# Patient Record
Sex: Male | Born: 1995 | Race: Black or African American | Hispanic: No | Marital: Single | Smoking: Current some day smoker
Health system: Southern US, Community
[De-identification: ages and names within clinical notes are randomized; demographics above are authoritative.]

---

## 2017-11-07 ENCOUNTER — Emergency Department
Admission: EM | Admit: 2017-11-07 | Discharge: 2017-11-07 | Disposition: A | Discharge status: Home / Self Care | Payer: Self-pay | Attending: Emergency Medicine | Admitting: Emergency Medicine

## 2017-11-07 ENCOUNTER — Other Ambulatory Visit: Payer: Self-pay

## 2017-11-07 DIAGNOSIS — F17200 Nicotine dependence, unspecified, uncomplicated: Secondary | ICD-10-CM | POA: Insufficient documentation

## 2017-11-07 DIAGNOSIS — M25511 Pain in right shoulder: Secondary | ICD-10-CM | POA: Insufficient documentation

## 2017-11-07 MED ORDER — IBUPROFEN 800 MG PO TABS: 800.0000 mg | ORAL_TABLET | Freq: Three times a day (TID) | ORAL | 0 refills | Status: DC | PRN

## 2017-11-07 NOTE — ED Triage Notes (Addendum)
Pt comes via POV with c/o right shoulder pain that began last Wednesday after incident at work. Pt states a chain fell on his shoulder. Pt denies LOC. Respirations even and unlabored. Pt states he wishes to file workers comp for incident. Pt works at [employer] here in [place].

## 2017-11-07 NOTE — Discharge Instructions (Signed)
Use sling for 2 days

## 2017-11-07 NOTE — ED Provider Notes (Signed)
[facility] Emergency Department Provider Note  ____________________________________________  Time seen: Approximately 2:15 PM  I have reviewed the triage vital signs and the nursing notes.   HISTORY  Chief Complaint Shoulder Pain    HPI Tony Green is a 22 y.o. male that presents to the emergency department for evaluation of right shoulder pain for 5 days.  Patient states that he was at work loading something and a chain fell on his shoulder.  He states that he has been using his shoulder but "its not 100%."  He would like a sling.  He is not concerned that anything is broken.  No additional injuries.  No numbness, tingling.   History reviewed. No pertinent past medical history.  There are no active problems to display for this patient.   History reviewed. No pertinent surgical history.  Prior to Admission medications   Medication Sig Start Date End Date Taking? Authorizing Provider  ibuprofen (ADVIL,MOTRIN) 800 MG tablet Take 1 tablet (800 mg total) by mouth every 8 (eight) hours as needed. 11/07/17   Enid Derry, PA-C    Allergies Patient has no allergy information on record.  No family history on file.  Social History Social History   Tobacco Use  . Smoking status: Current Some Day Smoker  . Smokeless tobacco: Never Used  Substance Use Topics  . Alcohol use: No    Frequency: Never  . Drug use: No     Review of Systems  Cardiovascular: No chest pain. Respiratory: No SOB. Gastrointestinal: No abdominal pain.   Musculoskeletal: Positive for shoulder pain. Skin: Negative for rash, abrasions, lacerations, ecchymosis. Neurological: Negative for numbness or tingling   ____________________________________________   PHYSICAL EXAM:  VITAL SIGNS: ED Triage Vitals  Enc Vitals Group     BP 11/07/17 1325 139/65     Pulse Rate 11/07/17 1325 (!) 57     Resp 11/07/17 1325 16     Temp 11/07/17 1325 98.1 F (36.7 C)     Temp Source  11/07/17 1325 Oral     SpO2 11/07/17 1325 100 %     Weight 11/07/17 1326 140 lb (63.5 kg)     Height 11/07/17 1326 5\' 5"  (1.651 m)     Head Circumference --      Peak Flow --      Pain Score 11/07/17 1332 5     Pain Loc --      Pain Edu? --      Excl. in GC? --      Constitutional: Alert and oriented. Well appearing and in no acute distress. Eyes: Conjunctivae are normal. PERRL. EOMI. Head: Atraumatic. ENT:      Ears:      Nose: No congestion/rhinnorhea.      Mouth/Throat: Mucous membranes are moist.  Neck: No stridor.  No cervical spine tenderness to palpation. Cardiovascular: Normal rate, regular rhythm.  Good peripheral circulation.  Symmetric radial pulses bilaterally. Respiratory: Normal respiratory effort without tachypnea or retractions. Lungs CTAB. Good air entry to the bases with no decreased or absent breath sounds. Musculoskeletal: Full range of motion to all extremities. No gross deformities appreciated.  Strength 5 out of 5 in upper extremities bilaterally.  No pain elicited with range of motion of right shoulder or with palpation of right shoulder.  No swelling, ecchymosis, erythema. Neurologic:  Normal speech and language. No gross focal neurologic deficits are appreciated.  Skin:  Skin is warm, dry and intact. No rash noted.   ____________________________________________   Vickie Epley (  all labs ordered are listed, but only abnormal results are displayed)  Labs Reviewed - No data to display ____________________________________________  EKG   ____________________________________________  RADIOLOGY   No results found.  ____________________________________________    PROCEDURES  Procedure(s) performed:    Procedures    Medications - No data to display   ____________________________________________   INITIAL IMPRESSION / ASSESSMENT AND PLAN / ED COURSE  Pertinent labs & imaging results that were available during my care of the patient were  reviewed by me and considered in my medical decision making (see chart for details).  Review of the [place] CSRS was performed in accordance of the [facility] prior to dispensing any controlled drugs.     Patient presented to the emergency department for evaluation of right shoulder pain for 6 days.  Vital signs and exam are reassuring.  Pain is likely musculoskeletal.  X-ray was offered and patient declined.  Patient asked for a sling and sling was given. He was recommended to only wear for 2 days.  Patient will be discharged home with prescriptions for ibuprofen. Patient is to follow up with PCP as directed. Patient is given ED precautions to return to the ED for any worsening or new symptoms.     ____________________________________________  FINAL CLINICAL IMPRESSION(S) / ED DIAGNOSES  Final diagnoses:  Acute pain of right shoulder      NEW MEDICATIONS STARTED DURING THIS VISIT:  ED Discharge Orders        Ordered    ibuprofen (ADVIL,MOTRIN) 800 MG tablet  Every 8 hours PRN     11/07/17 1449          This chart was dictated using voice recognition software/Dragon. Despite best efforts to proofread, errors can occur which can change the meaning. Any change was purely unintentional.    Enid DerryWagner, Skyeler Smola, PA-C 11/07/17 1454    Rockne MenghiniNorman, Anne-Caroline, MD 11/07/17 [id number]

## 2017-11-07 NOTE — ED Notes (Addendum)
See triage note  States he hurt is shoulder last weds while at work.  Having intermittent pain to right shoulder area  No deformity noted    Spoke with [place] [facility] [employer]   She states that he did not report the injury  And is no longer employed

## 2018-01-21 ENCOUNTER — Emergency Department: Payer: Self-pay

## 2018-01-21 ENCOUNTER — Encounter: Payer: Self-pay | Admitting: Emergency Medicine

## 2018-01-21 ENCOUNTER — Emergency Department
Admission: EM | Admit: 2018-01-21 | Discharge: 2018-01-22 | Disposition: A | Discharge status: Home / Self Care | Payer: Self-pay | Attending: Emergency Medicine | Admitting: Emergency Medicine

## 2018-01-21 DIAGNOSIS — S60142A Contusion of left ring finger with damage to nail, initial encounter: Secondary | ICD-10-CM | POA: Insufficient documentation

## 2018-01-21 DIAGNOSIS — Y929 Unspecified place or not applicable: Secondary | ICD-10-CM | POA: Insufficient documentation

## 2018-01-21 DIAGNOSIS — W231XXA Caught, crushed, jammed, or pinched between stationary objects, initial encounter: Secondary | ICD-10-CM | POA: Insufficient documentation

## 2018-01-21 DIAGNOSIS — S6010XA Contusion of unspecified finger with damage to nail, initial encounter: Secondary | ICD-10-CM

## 2018-01-21 DIAGNOSIS — Y999 Unspecified external cause status: Secondary | ICD-10-CM | POA: Insufficient documentation

## 2018-01-21 DIAGNOSIS — Y939 Activity, unspecified: Secondary | ICD-10-CM | POA: Insufficient documentation

## 2018-01-21 DIAGNOSIS — F17200 Nicotine dependence, unspecified, uncomplicated: Secondary | ICD-10-CM | POA: Insufficient documentation

## 2018-01-21 MED ORDER — MELOXICAM 15 MG PO TABS: 15.0000 mg | ORAL_TABLET | Freq: Every day | ORAL | 1 refills | Status: AC

## 2018-01-21 MED ORDER — MELOXICAM 7.5 MG PO TABS
15.0000 mg | ORAL_TABLET | Freq: Every day | ORAL | Status: DC
  Administered 2018-01-22: 15 mg via ORAL

## 2018-01-21 NOTE — ED Triage Notes (Signed)
Pt slammed left hand in car door this evening and is in ED tonight for pain, swelling and imaging for possible break. No obvious deformity noted.

## 2018-01-21 NOTE — ED Notes (Signed)
Pt reports he shut the tips of his 3rd, 4th and 5th finger on his left hand in the car door. Pt reports pain to those 3 fingers. Collection of blood noted under the finger nail of the 4th finger. CMS intacta. Ice pack given to pt.

## 2018-01-22 MED ORDER — MELOXICAM 7.5 MG PO TABS
ORAL_TABLET | ORAL | Status: AC
  Filled 2018-01-22: qty 2

## 2018-01-22 NOTE — ED Provider Notes (Signed)
[facility] Emergency Department Provider Note  ____________________________________________  Time seen: Approximately 12:14 AM  I have reviewed the triage vital signs and the nursing notes.   HISTORY  Chief Complaint Hand Injury    HPI Tony Green is a 22 y.o. male presents to the emergency department with a left fourth digit subungual hematoma after patient reportedly slammed his left hand in car door.  He is reporting 8 out of 10, aching pain.  No alleviating measures have been attempted.  Injury occurred this evening.  History reviewed. No pertinent past medical history.  There are no active problems to display for this patient.   History reviewed. No pertinent surgical history.  Prior to Admission medications   Medication Sig Start Date End Date Taking? Authorizing Provider  ibuprofen (ADVIL,MOTRIN) 800 MG tablet Take 1 tablet (800 mg total) by mouth every 8 (eight) hours as needed. 11/07/17   Enid Derry, PA-C  meloxicam (MOBIC) 15 MG tablet Take 1 tablet (15 mg total) by mouth daily for 7 days. 01/21/18 01/28/18  Orvil Feil, PA-C    Allergies Patient has no known allergies.  History reviewed. No pertinent family history.  Social History Social History   Tobacco Use  . Smoking status: Current Some Day Smoker  . Smokeless tobacco: Never Used  Substance Use Topics  . Alcohol use: Yes    Frequency: Never  . Drug use: No     Review of Systems  Constitutional: No fever/chills Eyes: No visual changes. No discharge ENT: No upper respiratory complaints. Cardiovascular: no chest pain. Respiratory: no cough. No SOB. Gastrointestinal: No abdominal pain.  No nausea, no vomiting.  No diarrhea.  No constipation. Musculoskeletal: Negative for musculoskeletal pain. Skin: Patient has left fourth digit subungual hematoma. Neurological: Negative for headaches, focal weakness or  numbness.   ____________________________________________   PHYSICAL EXAM:  VITAL SIGNS: ED Triage Vitals  Enc Vitals Group     BP 01/21/18 2211 (!) 145/72     Pulse Rate 01/21/18 2211 (!) 59     Resp 01/22/18 0010 16     Temp 01/21/18 2211 97.6 F (36.4 C)     Temp Source 01/21/18 2211 Oral     SpO2 01/21/18 2211 100 %     Weight 01/21/18 2211 140 lb (63.5 kg)     Height 01/21/18 2211  (1.651 m)     Head Circumference --      Peak Flow --      Pain Score 01/22/18 0010 6     Pain Loc --      Pain Edu? --      Excl. in GC? --      Constitutional: Alert and oriented. Well appearing and in no acute distress. Eyes: Conjunctivae are normal. PERRL. EOMI. Head: Atraumatic. Cardiovascular: Normal rate, regular rhythm. Normal S1 and S2.  Good peripheral circulation. Respiratory: Normal respiratory effort without tachypnea or retractions. Lungs CTAB. Good air entry to the bases with no decreased or absent breath sounds. Musculoskeletal: Full range of motion to all extremities. No gross deformities appreciated. Neurologic:  Normal speech and language. No gross focal neurologic deficits are appreciated.  Skin:  Patient has left fourth digit subungual hematoma. Psychiatric: Mood and affect are normal. Speech and behavior are normal. Patient exhibits appropriate insight and judgement.   ____________________________________________   LABS (all labs ordered are listed, but only abnormal results are displayed)  Labs Reviewed - No data to display ____________________________________________  EKG   ____________________________________________  RADIOLOGY  Geraldo Pitter, personally viewed and evaluated these images (plain radiographs) as part of my medical decision making, as well as reviewing the written report by the radiologist.  Dg Hand Complete Left  Result Date: 01/21/2018 CLINICAL DATA:  Hand shut in car door.  Pain. EXAM: LEFT HAND - COMPLETE 3+ VIEW COMPARISON:   None. FINDINGS: There is no evidence of fracture or dislocation. There is no evidence of arthropathy or other focal bone abnormality. Soft tissues are unremarkable. IMPRESSION: Negative. Electronically Signed   By: Awilda Metro M.D.   On: 01/21/2018 23:31    ____________________________________________    PROCEDURES  Procedure(s) performed:    Procedures  Patient underwent trephination without complication.  Medications  meloxicam (MOBIC) tablet 15 mg (15 mg Oral Given 01/22/18 0009)     ____________________________________________   INITIAL IMPRESSION / ASSESSMENT AND PLAN / ED COURSE  Pertinent labs & imaging results that were available during my care of the patient were reviewed by me and considered in my medical decision making (see chart for details).  Review of the [place] CSRS was performed in accordance of the [facility] prior to dispensing any controlled drugs.     Assessment and plan Subungual hematoma Hand pain Patient presents to the emergency department with left hand pain after slamming his hand in the car door.  Differential diagnosis included fracture, contusion and subungual hematoma.  Patient underwent trephination in the emergency department without complication.  He was given his first dose of meloxicam in the emergency department.  He was discharged with meloxicam.  All patient questions were answered.    ____________________________________________  FINAL CLINICAL IMPRESSION(S) / ED DIAGNOSES  Final diagnoses:  Subungual hematoma of digit of hand, initial encounter      NEW MEDICATIONS STARTED DURING THIS VISIT:  ED Discharge Orders        Ordered    meloxicam (MOBIC) 15 MG tablet  Daily     01/21/18 2359          This chart was dictated using voice recognition software/Dragon. Despite best efforts to proofread, errors can occur which can change the meaning. Any change was purely unintentional.    Orvil Feil, PA-C 01/22/18  0017    Myrna Blazer, MD 01/22/18 2227

## 2019-03-08 IMAGING — CR DG HAND COMPLETE 3+V*L*
1 series · 3 of 3 positions shown · non-contrast
Comparison: None.

CLINICAL DATA: Hand shut in car door.  Pain.

EXAM:
LEFT HAND - COMPLETE 3+ VIEW

[Series 1: dg hand complete left · 0.14mm/px · 3 of 3 slices shown]
[im 1/3]
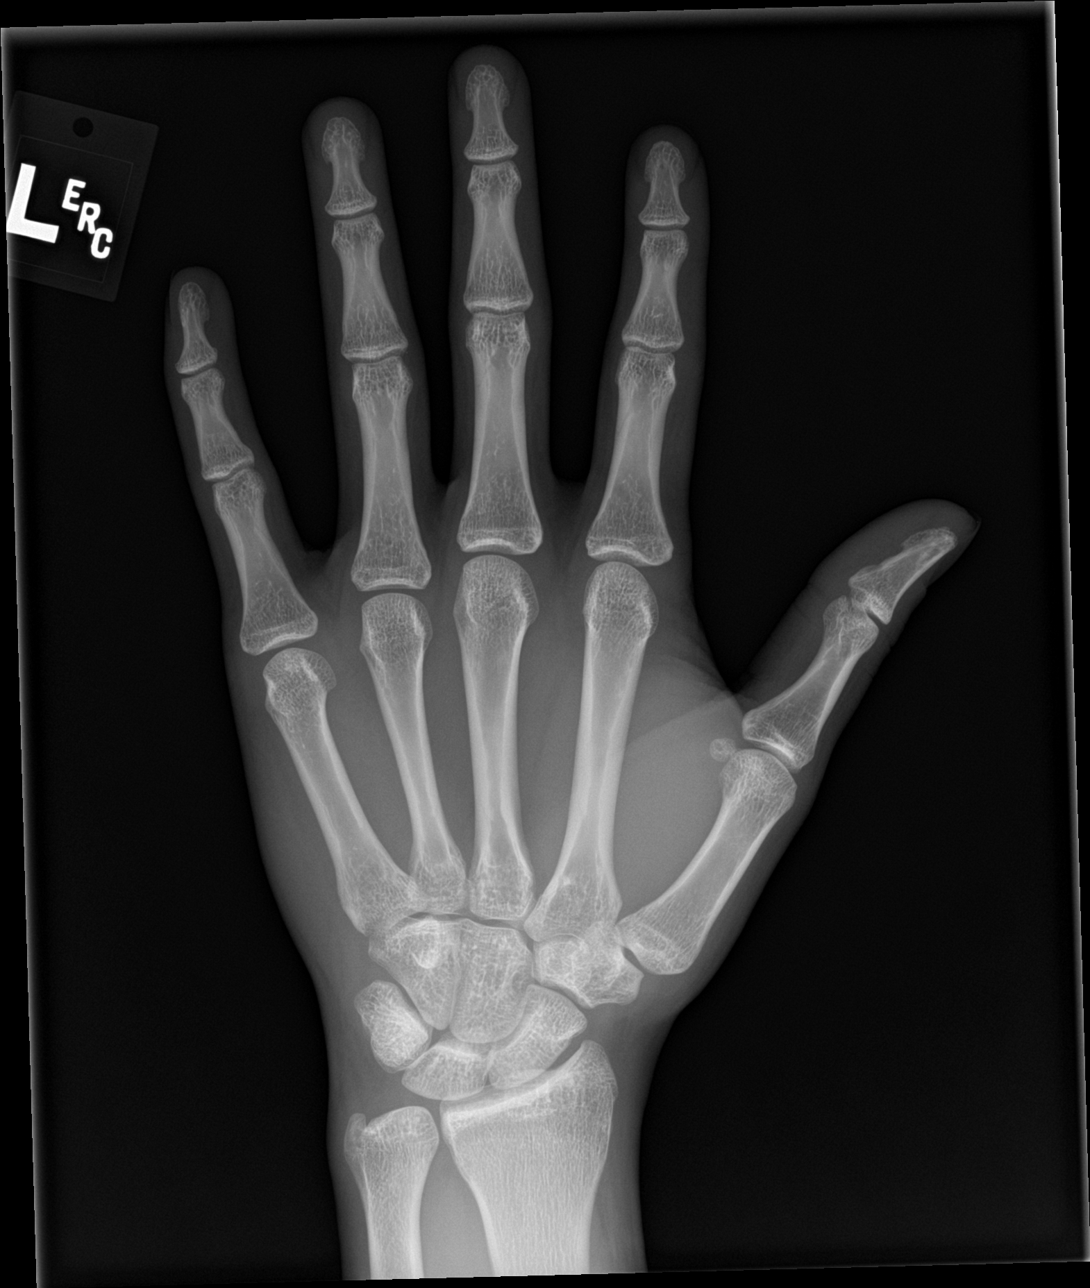
[im 2/3]
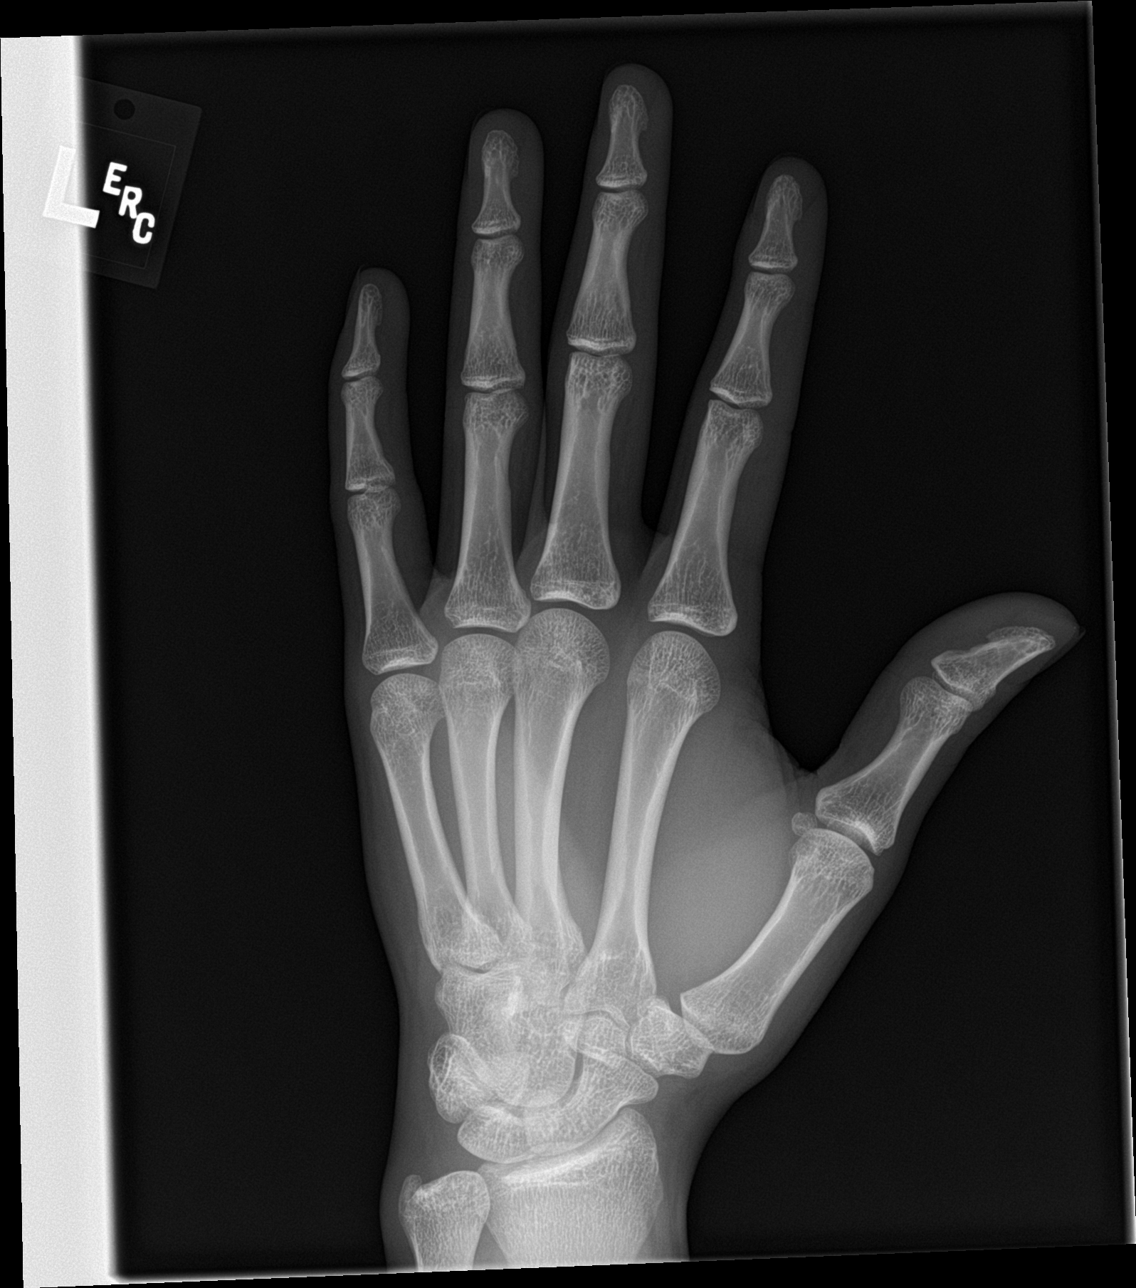
[im 3/3]
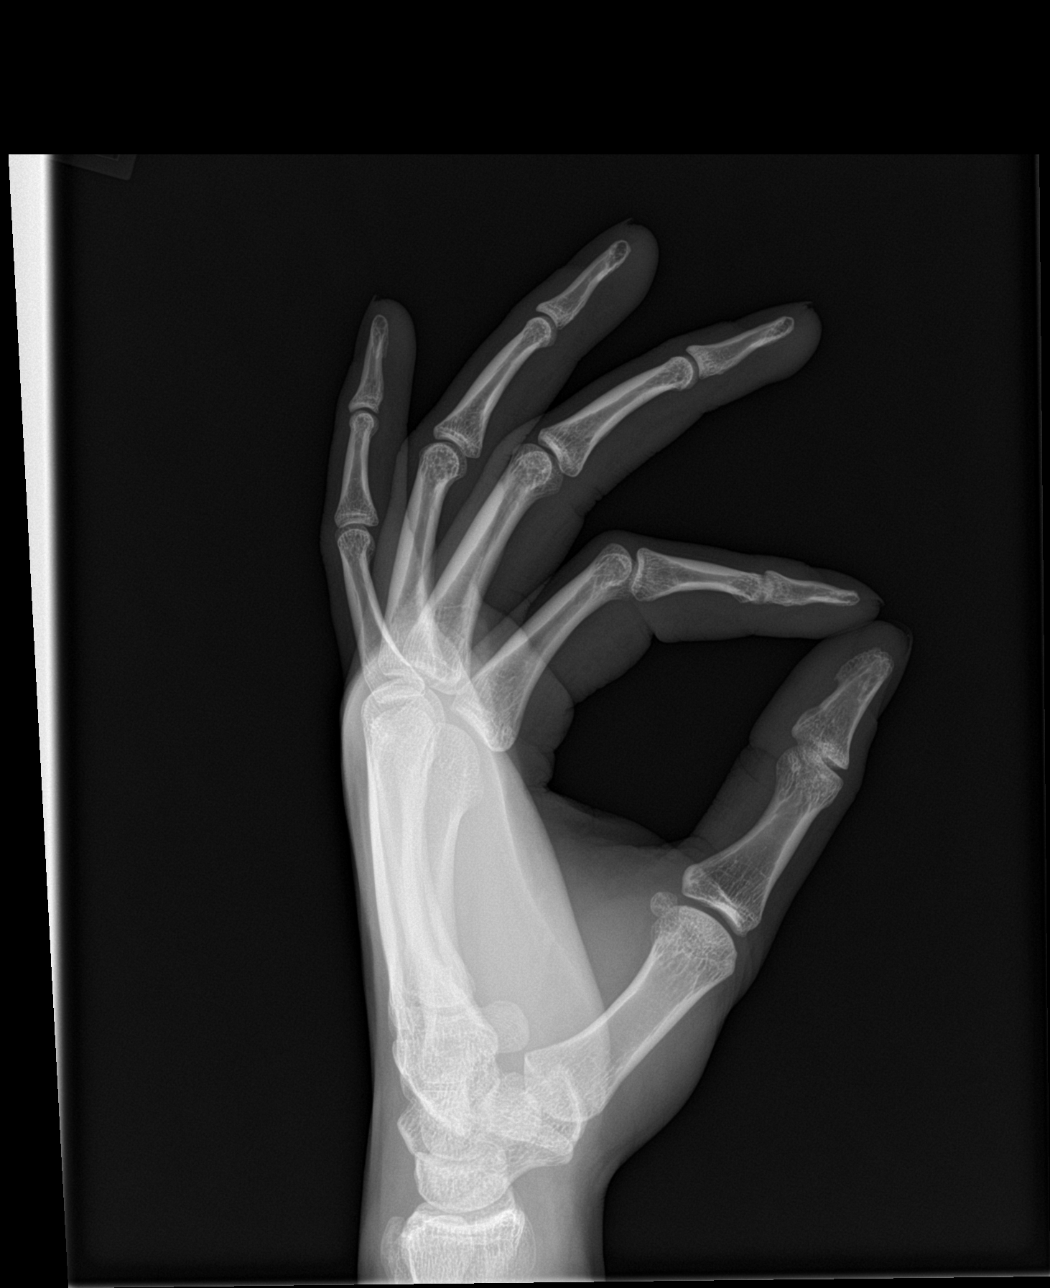

[3 of 3 positions shown; findings below may reference images not displayed]

FINDINGS: There is no evidence of fracture or dislocation. There is no
evidence of arthropathy or other focal bone abnormality. Soft
tissues are unremarkable.
IMPRESSION: Negative.

## 2019-03-25 ENCOUNTER — Other Ambulatory Visit: Payer: Self-pay

## 2019-03-25 ENCOUNTER — Encounter: Payer: Self-pay | Admitting: Emergency Medicine

## 2019-03-25 ENCOUNTER — Emergency Department
Admission: EM | Admit: 2019-03-25 | Discharge: 2019-03-25 | Disposition: A | Discharge status: Home / Self Care | Payer: HRSA Program | Attending: Emergency Medicine | Admitting: Emergency Medicine

## 2019-03-25 DIAGNOSIS — R509 Fever, unspecified: Secondary | ICD-10-CM | POA: Diagnosis present

## 2019-03-25 DIAGNOSIS — F17200 Nicotine dependence, unspecified, uncomplicated: Secondary | ICD-10-CM | POA: Diagnosis not present

## 2019-03-25 DIAGNOSIS — U071 COVID-19: Secondary | ICD-10-CM | POA: Diagnosis not present

## 2019-03-25 DIAGNOSIS — B349 Viral infection, unspecified: Secondary | ICD-10-CM

## 2019-03-25 NOTE — ED Triage Notes (Signed)
C/O chills, fever, productive cough, diarrhea x 1 day.  States boss tested positive for COVID 19.  Tylenol taken last at 0200.

## 2019-03-25 NOTE — Discharge Instructions (Signed)
Return to the ED if any severe worsening of your symptoms or difficulty breathing and increased shortness of breath.  Continue drinking fluids.  Take Tylenol or ibuprofen as needed for body aches or fever.  You are to stay out of work until you have received information about your COVID test.  If this test is positive you will need to stay out of work for 14 days.

## 2019-03-25 NOTE — ED Notes (Signed)
See triage note  Presents with h/a, subjective fever at home and intermittent cough  Low grade fever noted on arrival  States he was exposed to [place] by his employer

## 2019-03-25 NOTE — ED Provider Notes (Signed)
[facility] Emergency Department Provider Note  ____________________________________________   First MD Initiated Contact with Patient 03/25/19 [id number]     (approximate)  I have reviewed the triage vital signs and the nursing notes.   HISTORY  Chief Complaint not feeling well   HPI Tony Green is a 23 y.o. male presents to the ED with complaint of headache, subjective fever, intermittent cough and diarrhea starting today x1.  Patient states that his boss tested positive for Covid 19.  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  He took Tylenol at 2 AM.  He rates his pain as a 7 out of 10.      History reviewed. No pertinent past medical history.  There are no active problems to display for this patient.   History reviewed. No pertinent surgical history.  Prior to Admission medications   Not on File    Allergies Patient has no known allergies.  No family history on file.  Social History Social History   Tobacco Use  . Smoking status: Current Some Day Smoker  . Smokeless tobacco: Never Used  Substance Use Topics  . Alcohol use: Yes    Frequency: Never  . Drug use: No    Review of Systems Constitutional: Positive fever/chills Eyes: No visual changes. ENT: No sore throat. Cardiovascular: Denies chest pain. Respiratory: Denies shortness of breath.  Positive for intermittent cough. Gastrointestinal:  No nausea, no vomiting.  Positive for diarrhea. Musculoskeletal: Positive for muscle aches. Skin: Negative for rash. Neurological: Negative for headaches, focal weakness or numbness. ___________________________________________   PHYSICAL EXAM:  VITAL SIGNS: ED Triage Vitals  Enc Vitals Group     BP 03/25/19 0912 137/67     Pulse Rate 03/25/19 0912 75     Resp --      Temp 03/25/19 0912 100.3 F (37.9 C)     Temp Source 03/25/19 0912 Oral     SpO2 03/25/19 0912 100 %   Weight 03/25/19 0912 145 lb (65.8 kg)     Height 03/25/19 0912 5\' 5"  (1.651 m)     Head Circumference --      Peak Flow --      Pain Score 03/25/19 0924 7     Pain Loc --      Pain Edu? --      Excl. in GC? --     Constitutional: Alert and oriented. Well appearing and in no acute distress. Eyes: Conjunctivae are normal. PERRL. EOMI. Head: Atraumatic. Nose: No congestion/rhinnorhea. Mouth/Throat: Mucous membranes are moist.  Oropharynx non-erythematous. Neck: No stridor.   Hematological/Lymphatic/Immunilogical: No cervical lymphadenopathy. Cardiovascular: Normal rate, regular rhythm. Grossly normal heart sounds.  Good peripheral circulation. Respiratory: Normal respiratory effort.  No retractions. Lungs CTAB. Gastrointestinal: Soft and nontender. No distention. Musculoskeletal: Moves upper and lower extremities without any difficulty.  Normal gait was noted. Neurologic:  Normal speech and language. No gross focal neurologic deficits are appreciated. No gait instability. Skin:  Skin is warm, dry and intact. No rash noted. Psychiatric: Mood and affect are normal. Speech and behavior are normal.  ____________________________________________   LABS (all labs ordered are listed, but only abnormal results are displayed)  Labs Reviewed  NOVEL CORONAVIRUS, NAA (HOSPITAL ORDER, SEND-OUT TO REF LAB)   PROCEDURES  Procedure(s) performed (including Critical Care):  Procedures   ____________________________________________   INITIAL IMPRESSION / ASSESSMENT AND PLAN / ED COURSE  As part of my medical decision  making, I reviewed the following data within the electronic MEDICAL RECORD NUMBER Notes from prior ED visits and  Controlled Substance Database  23 year old male presents to the ED with complaint of fever, chills, productive cough for several days and diarrhea for 1 day.  He states that his boss tested positive for COVID-19.  He has been taking Tylenol for his fever with the last  being at 2 AM.  He denies any shortness of breath or difficulty breathing at this time.  A COVID test was sent out and patient was made aware that he will be out until his cover test is negative however he is aware that he will be out much longer if his test is positive.  ____________________________________________   FINAL CLINICAL IMPRESSION(S) / ED DIAGNOSES  Final diagnoses:  Viral illness     ED Discharge Orders    None       Note:  This document was prepared using Dragon voice recognition software and may include unintentional dictation errors.    Johnn Hai, PA-C 03/25/19 1040    Delman Kitten, MD 03/25/19 2124

## 2019-03-27 ENCOUNTER — Telehealth: Payer: Self-pay | Admitting: *Deleted

## 2019-03-27 LAB — NOVEL CORONAVIRUS, NAA (HOSP ORDER, SEND-OUT TO REF LAB; TAT 18-24 HRS): SARS-CoV-2, NAA: DETECTED — AB

## 2019-03-27 NOTE — Telephone Encounter (Signed)
Pt called to get his test results from the covid-19 test. His test showed that he was positive and he does have the virus. He has been in quarantine for the about 2 to 3 days. He had had a fever, body aches and some shortness of breath.  He is advised to continue to stay in quarantine for about 10 more days.   To be mindful of his symptoms and to call 911 for any respiratory distress, take Tylenol for the muscular pain. And also to stay hydrated, get fresh air and take his vitamins as before. He voiced understanding.

## 2021-06-04 ENCOUNTER — Emergency Department
Admission: EM | Admit: 2021-06-04 | Discharge: 2021-06-04 | Disposition: A | Discharge status: Home / Self Care | Payer: Medicaid Other | Attending: Emergency Medicine | Admitting: Emergency Medicine

## 2021-06-04 ENCOUNTER — Other Ambulatory Visit: Payer: Self-pay

## 2021-06-04 ENCOUNTER — Emergency Department: Payer: Medicaid Other

## 2021-06-04 ENCOUNTER — Encounter: Payer: Self-pay | Admitting: Emergency Medicine

## 2021-06-04 DIAGNOSIS — J069 Acute upper respiratory infection, unspecified: Secondary | ICD-10-CM | POA: Insufficient documentation

## 2021-06-04 DIAGNOSIS — Z20822 Contact with and (suspected) exposure to covid-19: Secondary | ICD-10-CM | POA: Insufficient documentation

## 2021-06-04 DIAGNOSIS — R059 Cough, unspecified: Secondary | ICD-10-CM | POA: Diagnosis present

## 2021-06-04 DIAGNOSIS — F17200 Nicotine dependence, unspecified, uncomplicated: Secondary | ICD-10-CM | POA: Insufficient documentation

## 2021-06-04 LAB — RESP PANEL BY RT-PCR (FLU A&B, COVID) ARPGX2
Influenza A by PCR: NEGATIVE
Influenza B by PCR: NEGATIVE
SARS Coronavirus 2 by RT PCR: NEGATIVE

## 2021-06-04 MED ORDER — PSEUDOEPH-BROMPHEN-DM 30-2-10 MG/5ML PO SYRP: 5.0000 mL | ORAL_SOLUTION | Freq: Four times a day (QID) | ORAL | 0 refills | Status: AC | PRN

## 2021-06-04 NOTE — ED Provider Notes (Signed)
HPI: Pt is a 25 y.o. male who presents with complaints of @CCN2 @.  The patient p/w  cough and wheezing two days.   ROS: Denies fever, chest pain, vomiting  History reviewed. No pertinent past medical history. There were no vitals filed for this visit.  Focused Physical Exam: Gen: No acute distress Head: atraumatic, normocephalic Eyes: Extraocular movements grossly intact; conjunctiva clear CV: RRR Lung: No increased WOB, no stridor GI: ND, no obvious masses Neuro: Alert and awake  Medical Decision Making and Plan: Given the patient's initial medical screening exam, the following diagnostic evaluation has been ordered. The patient will be placed in the appropriate treatment space, once one is available, to complete the evaluation and treatment. I have discussed the plan of care with the patient and I have advised the patient that an ED physician or mid-level practitioner will reevaluate their condition after the test results have been received, as the results may give them additional insight into the type of treatment they may need.   Diagnostics: no wheezing now. Xray and covid test   Treatments: none immediately   , MD 06/04/21 1345

## 2021-06-04 NOTE — ED Provider Notes (Signed)
[facility] Emergency Department Provider Note   ____________________________________________   Event Date/Time   First MD Initiated Contact with Patient 06/04/21 1304     (approximate)  I have reviewed the triage vital signs and the nursing notes.   HISTORY  Chief Complaint Cough and Wheezing    HPI Tony Green is a 25 y.o. male patient presents with 2 days of cough and wheezing.  Patient also have a daughter with the same complaints.  Denies recent travel or known contact with COVID-19.  Patient has taken the COVID-vaccine with boosters.  Denies chest pain or shortness of breath.  Denies fatigue of sore throat.  No palliative measure for complaint.      History reviewed. No pertinent past medical history.  There are no problems to display for this patient.   History reviewed. No pertinent surgical history.  Prior to Admission medications   Medication Sig Start Date End Date Taking? Authorizing Provider  brompheniramine-pseudoephedrine-DM 30-2-10 MG/5ML syrup Take 5 mLs by mouth 4 (four) times daily as needed. 06/04/21  Yes Joni Reining, PA-C    Allergies Patient has no known allergies.  History reviewed. No pertinent family history.  Social History Social History   Tobacco Use   Smoking status: Some Days   Smokeless tobacco: Never  Substance Use Topics   Alcohol use: Yes   Drug use: No    Review of Systems  Constitutional: No fever/chills Eyes: No visual changes. ENT: No sore throat. Cardiovascular: Denies chest pain. Respiratory: Denies shortness of breath.  Nonproductive cough Gastrointestinal: No abdominal pain.  No nausea, no vomiting.  No diarrhea.  No constipation. Genitourinary: Negative for dysuria. Musculoskeletal: Negative for back pain. Skin: Negative for rash. Neurological: Negative for headaches, focal weakness or numbness.   ____________________________________________   PHYSICAL EXAM:  VITAL  SIGNS: ED Triage Vitals  Enc Vitals Group     BP 06/04/21 1345 136/71     Pulse Rate 06/04/21 1345 97     Resp 06/04/21 1345 18     Temp 06/04/21 1345 98.9 F (37.2 C)     Temp Source 06/04/21 1345 Oral     SpO2 06/04/21 1345 98 %     Weight 06/04/21 1301 145 lb 1 oz (65.8 kg)     Height 06/04/21 1301 5\' 5"  (1.651 m)     Head Circumference --      Peak Flow --      Pain Score 06/04/21 1301 0     Pain Loc --      Pain Edu? --      Excl. in GC? --    Constitutional: Alert and oriented. Well appearing and in no acute distress. Eyes: Conjunctivae are normal. PERRL. EOMI. Head: Atraumatic. Nose: No congestion/rhinnorhea. Mouth/Throat: Mucous membranes are moist.  Oropharynx non-erythematous. Neck: No stridor.   Hematological/Lymphatic/Immunilogical: No cervical lymphadenopathy. Cardiovascular: Normal rate, regular rhythm. Grossly normal heart sounds.  Good peripheral circulation. Respiratory: Normal respiratory effort.  No retractions. Lungs CTAB. Genitourinary: Deferred Musculoskeletal: No lower extremity tenderness nor edema.  No joint effusions. Neurologic:  Normal speech and language. No gross focal neurologic deficits are appreciated. No gait instability. Skin:  Skin is warm, dry and intact. No rash noted. Psychiatric: Mood and affect are normal. Speech and behavior are normal.  ____________________________________________   LABS (all labs ordered are listed, but only abnormal results are displayed)  Labs Reviewed  RESP PANEL BY RT-PCR (FLU A&B, COVID) ARPGX2   ____________________________________________  EKG   ____________________________________________  RADIOLOGY I, Joni Reining, personally viewed and evaluated these images (plain radiographs) as part of my medical decision making, as well as reviewing the written report by the radiologist.  ED MD interpretation: No acute findings on chest x-ray.  Official radiology report(s): DG Chest 2 View  Result  Date: 06/04/2021 CLINICAL DATA:  Cough, shortness of breath EXAM: CHEST - 2 VIEW COMPARISON:  None. FINDINGS: The heart size and mediastinal contours are within normal limits. No focal airspace consolidation, pleural effusion, or pneumothorax. Slight scoliotic thoracic curvature. The visualized skeletal structures are otherwise unremarkable. IMPRESSION: No active cardiopulmonary disease. Electronically Signed   By: Duanne Guess D.O.   On: 06/04/2021 14:52    ____________________________________________   PROCEDURES  Procedure(s) performed (including Critical Care):  Procedures   ____________________________________________   INITIAL IMPRESSION / ASSESSMENT AND PLAN / ED COURSE  As part of my medical decision making, I reviewed the following data within the electronic MEDICAL RECORD NUMBER         Patient presents with 2 to 3 days of cough and wheezing.  Patient was concerned for COVID-19.  Advised patient is tested negative for COVID-19 and influenza.  Patient complaint physical exam consistent with viral respiratory infection with cough.  Patient given discharge care instruction advised take medication as directed.  Patient advised follow-up PCP      ____________________________________________   FINAL CLINICAL IMPRESSION(S) / ED DIAGNOSES  Final diagnoses:  Viral URI with cough     ED Discharge Orders          Ordered    brompheniramine-pseudoephedrine-DM 30-2-10 MG/5ML syrup  4 times daily PRN        06/04/21 1525             Note:  This document was prepared using Dragon voice recognition software and may include unintentional dictation errors.    Joni Reining, PA-C 06/04/21 1530    Jene Every, MD 06/08/21 1430

## 2021-06-04 NOTE — ED Triage Notes (Signed)
Pt comes into the ED via POV c/o cough and wheezing.  Pt has no audible wheezing at this time and is in NAd with even and unlabored respirations. Pt states symptoms have been ongoing x 2 days.

## 2021-06-04 NOTE — Discharge Instructions (Signed)
Your test was negative for COVID-19 and influenza.  No acute findings on chest x-ray.  Read and follow discharge care instruction.  Take medication as needed for cough.

## 2021-07-26 ENCOUNTER — Other Ambulatory Visit: Payer: Self-pay

## 2021-07-26 ENCOUNTER — Emergency Department
Admission: EM | Admit: 2021-07-26 | Discharge: 2021-07-26 | Disposition: A | Discharge status: Home / Self Care | Payer: Medicaid Other | Attending: Emergency Medicine | Admitting: Emergency Medicine

## 2021-07-26 DIAGNOSIS — F17200 Nicotine dependence, unspecified, uncomplicated: Secondary | ICD-10-CM | POA: Diagnosis not present

## 2021-07-26 DIAGNOSIS — H1031 Unspecified acute conjunctivitis, right eye: Secondary | ICD-10-CM

## 2021-07-26 DIAGNOSIS — J3489 Other specified disorders of nose and nasal sinuses: Secondary | ICD-10-CM | POA: Insufficient documentation

## 2021-07-26 DIAGNOSIS — R519 Headache, unspecified: Secondary | ICD-10-CM

## 2021-07-26 DIAGNOSIS — R0981 Nasal congestion: Secondary | ICD-10-CM | POA: Insufficient documentation

## 2021-07-26 MED ORDER — METOCLOPRAMIDE HCL 10 MG PO TABS
10.0000 mg | ORAL_TABLET | Freq: Once | ORAL | Status: AC
  Administered 2021-07-26: 10 mg via ORAL
  Filled 2021-07-26: qty 1

## 2021-07-26 MED ORDER — ERYTHROMYCIN 5 MG/GM OP OINT: 1.0000 "application " | TOPICAL_OINTMENT | Freq: Four times a day (QID) | OPHTHALMIC | 0 refills | Status: AC

## 2021-07-26 MED ORDER — KETOROLAC TROMETHAMINE 30 MG/ML IJ SOLN
15.0000 mg | Freq: Once | INTRAMUSCULAR | Status: AC
  Administered 2021-07-26: 15 mg via INTRAMUSCULAR
  Filled 2021-07-26: qty 1

## 2021-07-26 MED ORDER — NAPROXEN 250 MG PO TABS: 250.0000 mg | ORAL_TABLET | Freq: Two times a day (BID) | ORAL | 0 refills | Status: AC

## 2021-07-26 NOTE — ED Provider Notes (Signed)
[facility]  ____________________________________________   Event Date/Time   First MD Initiated Contact with Patient 07/26/21 [phone]     (approximate)  I have reviewed the triage vital signs and the nursing notes.   HISTORY  Chief Complaint Headache    HPI Tony Green is a 25 y.o. male with no significant past medical history presents with headache and facial pain.  Symptoms started 2 days ago.  Patient notes pain in the bilateral cheeks and primarily the left side of his forehead.  Also notes rhinorrhea and congestion.  Initially was having significant nasal discharge but now this has subsided.  He also notes some pain with biting on the left molars as well as right eye discomfort.  He denies fevers or chills.  No neck pain.  No sick contacts.  No history of migraines.  Has tried over-the-counter decongestants without relief.         History reviewed. No pertinent past medical history.  There are no problems to display for this patient.   History reviewed. No pertinent surgical history.  Prior to Admission medications   Medication Sig Start Date End Date Taking? Authorizing Provider  erythromycin ophthalmic ointment Place 1 application into the right eye 4 (four) times daily for 5 days. 07/26/21 07/31/21 Yes Rada Hay, MD  naproxen (NAPROSYN) 250 MG tablet Take 1 tablet (250 mg total) by mouth 2 (two) times daily with a meal for 7 days. 07/26/21 08/02/21 Yes Rada Hay, MD  brompheniramine-pseudoephedrine-DM 30-2-10 MG/5ML syrup Take 5 mLs by mouth 4 (four) times daily as needed. 06/04/21   Sable Feil, PA-C    Allergies Patient has no known allergies.  History reviewed. No pertinent family history.  Social History Social History   Tobacco Use   Smoking status: Some Days   Smokeless tobacco: Never  Substance Use Topics   Alcohol use: Yes   Drug use: No    Review of Systems   Review of Systems  Constitutional:   Negative for chills and fever.  HENT:  Positive for congestion, rhinorrhea, sinus pressure and sinus pain. Negative for ear pain and facial swelling.   Eyes:  Positive for pain and redness. Negative for photophobia and visual disturbance.  Musculoskeletal:  Negative for neck pain.  Neurological:  Positive for headaches. Negative for weakness and numbness.  All other systems reviewed and are negative.  Physical Exam Updated Vital Signs BP (!) 143/77   Pulse 94   Temp 98.9 F (37.2 C) (Oral)   Resp 16   Ht 5\' 5"  (1.651 m)   Wt 72.6 kg   SpO2 99%   BMI 26.63 kg/m   Physical Exam Vitals and nursing note reviewed.  Constitutional:      General: He is not in acute distress.    Appearance: Normal appearance.  HENT:     Head: Normocephalic and atraumatic.  Eyes:     General: No scleral icterus.    Extraocular Movements: Extraocular movements intact.     Conjunctiva/sclera: Conjunctivae normal.     Pupils: Pupils are equal, round, and reactive to light.     Comments: Right eye with conjunctival injection and some purulent crusting at the medial boarder No pain with extraocular movements and extraocular movements are intact Palpable right preauricular lymph node  Is to palpation over the bilateral maxillary sinuses  No significant facial swelling, no tenderness to percussion of the lower molars  Bilateral shotty cervical lymphadenopathy  Pulmonary:  Effort: Pulmonary effort is normal. No respiratory distress.     Breath sounds: Normal breath sounds. No wheezing.  Musculoskeletal:        General: No deformity or signs of injury.     Cervical back: Normal range of motion.  Skin:    Coloration: Skin is not jaundiced or pale.  Neurological:     General: No focal deficit present.     Mental Status: He is alert and oriented to person, place, and time. Mental status is at baseline.     Comments: Aox3, nml speech  PERRL, EOMI, face symmetric, nml tongue movement  5/5 strength  in the BL upper and lower extremities  Sensation grossly intact in the BL upper and lower extremities  Finger-nose-finger intact BL   Psychiatric:        Mood and Affect: Mood normal.        Behavior: Behavior normal.     LABS (all labs ordered are listed, but only abnormal results are displayed)  Labs Reviewed - No data to display ____________________________________________  EKG  N/a ____________________________________________  RADIOLOGY Ky Barban, personally viewed and evaluated these images (plain radiographs) as part of my medical decision making, as well as reviewing the written report by the radiologist.  ED MD interpretation:  n/a    ____________________________________________   PROCEDURES  Procedure(s) performed (including Critical Care):  Procedures   ____________________________________________   INITIAL IMPRESSION / ASSESSMENT AND PLAN / ED COURSE     25 year old male presents with 2 days of facial pain eye redness and headache.  Vital signs within normal limits.  On physical exam he does have right conjunctival injection and some tenderness over the bilateral maxillary sinuses but no extraocular movements without pain associated, no facial swelling no meningismus and a normal neurologic exam.  I suspect viral sinusitis as etiology of his symptoms.  Given the unilateral conjunctival injection we will also treat for an uncomplicated bacterial conjunctivitis.  Patient does not wear contacts.  Will treat supportively with NSAIDs.      ____________________________________________   FINAL CLINICAL IMPRESSION(S) / ED DIAGNOSES  Final diagnoses:  Sinus headache  Acute conjunctivitis of right eye, unspecified acute conjunctivitis type     ED Discharge Orders          Ordered    naproxen (NAPROSYN) 250 MG tablet  2 times daily with meals        07/26/21 0919    erythromycin ophthalmic ointment  4 times daily        07/26/21 0919              Note:  This document was prepared using Dragon voice recognition software and may include unintentional dictation errors.    Georga Hacking, MD 07/26/21 [phone]

## 2021-07-26 NOTE — Discharge Instructions (Addendum)
You likely have a viral infection that is causing your sinus pain and headache.  Please take naproxen twice daily for your pain.  You can also take over-the-counter decongestions like Sudafed to help with your congestion.  You may also have a bacterial infection in your right eye.  Please use the erythromycin ophthalmic ointment 4 times a day for the next several days until it has improved.

## 2021-07-26 NOTE — ED Triage Notes (Signed)
Pt presents to ER c/o headache x2 days.  Pt denies hx of migraines. Pt states light and sounds make headache worse.  Pt A&O x4.  No other neurological problems noted,

## 2022-07-20 IMAGING — CR DG CHEST 2V
2 series · 2 of 2 positions shown · non-contrast
Comparison: None.

CLINICAL DATA: Cough, shortness of breath

EXAM:
CHEST - 2 VIEW

[chest pa]
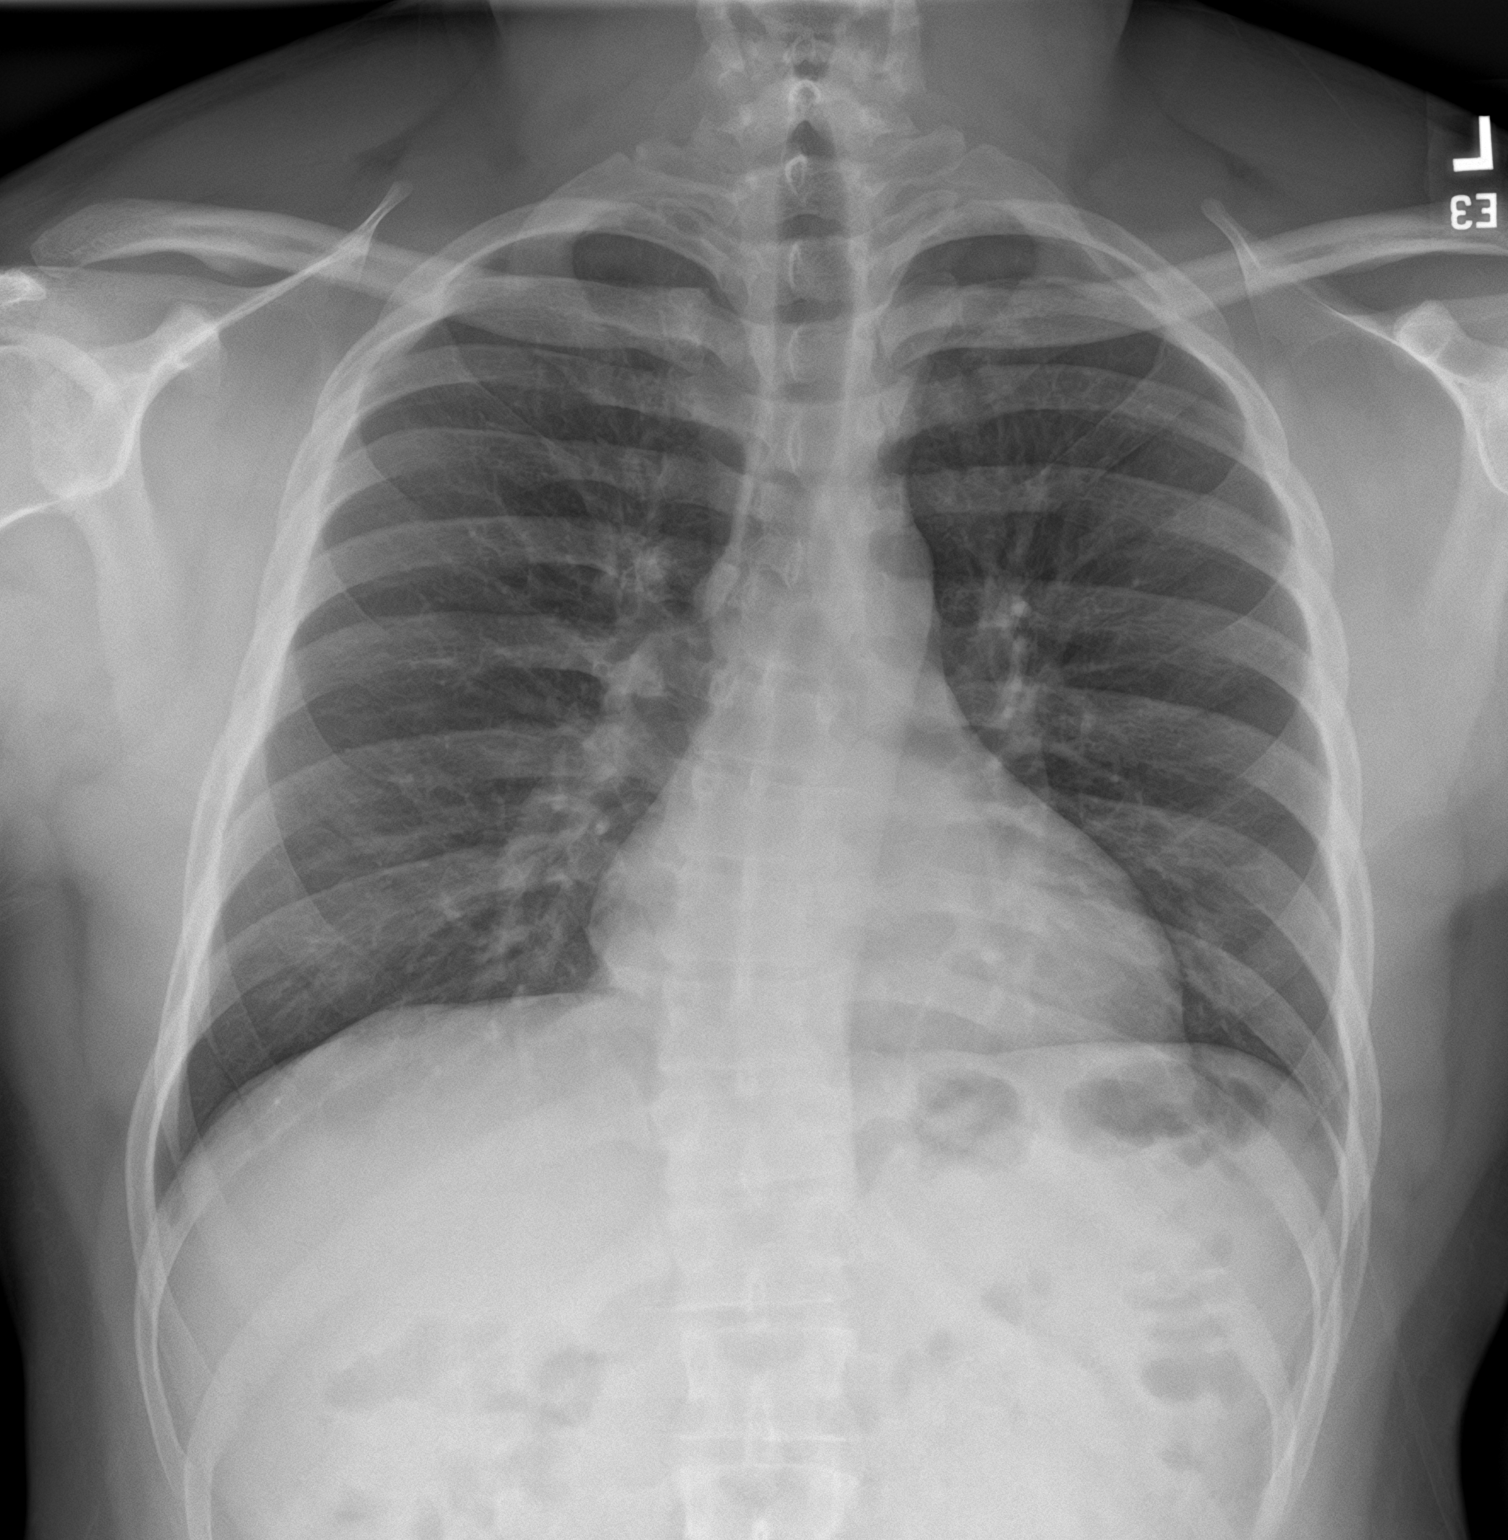

[chest lat]
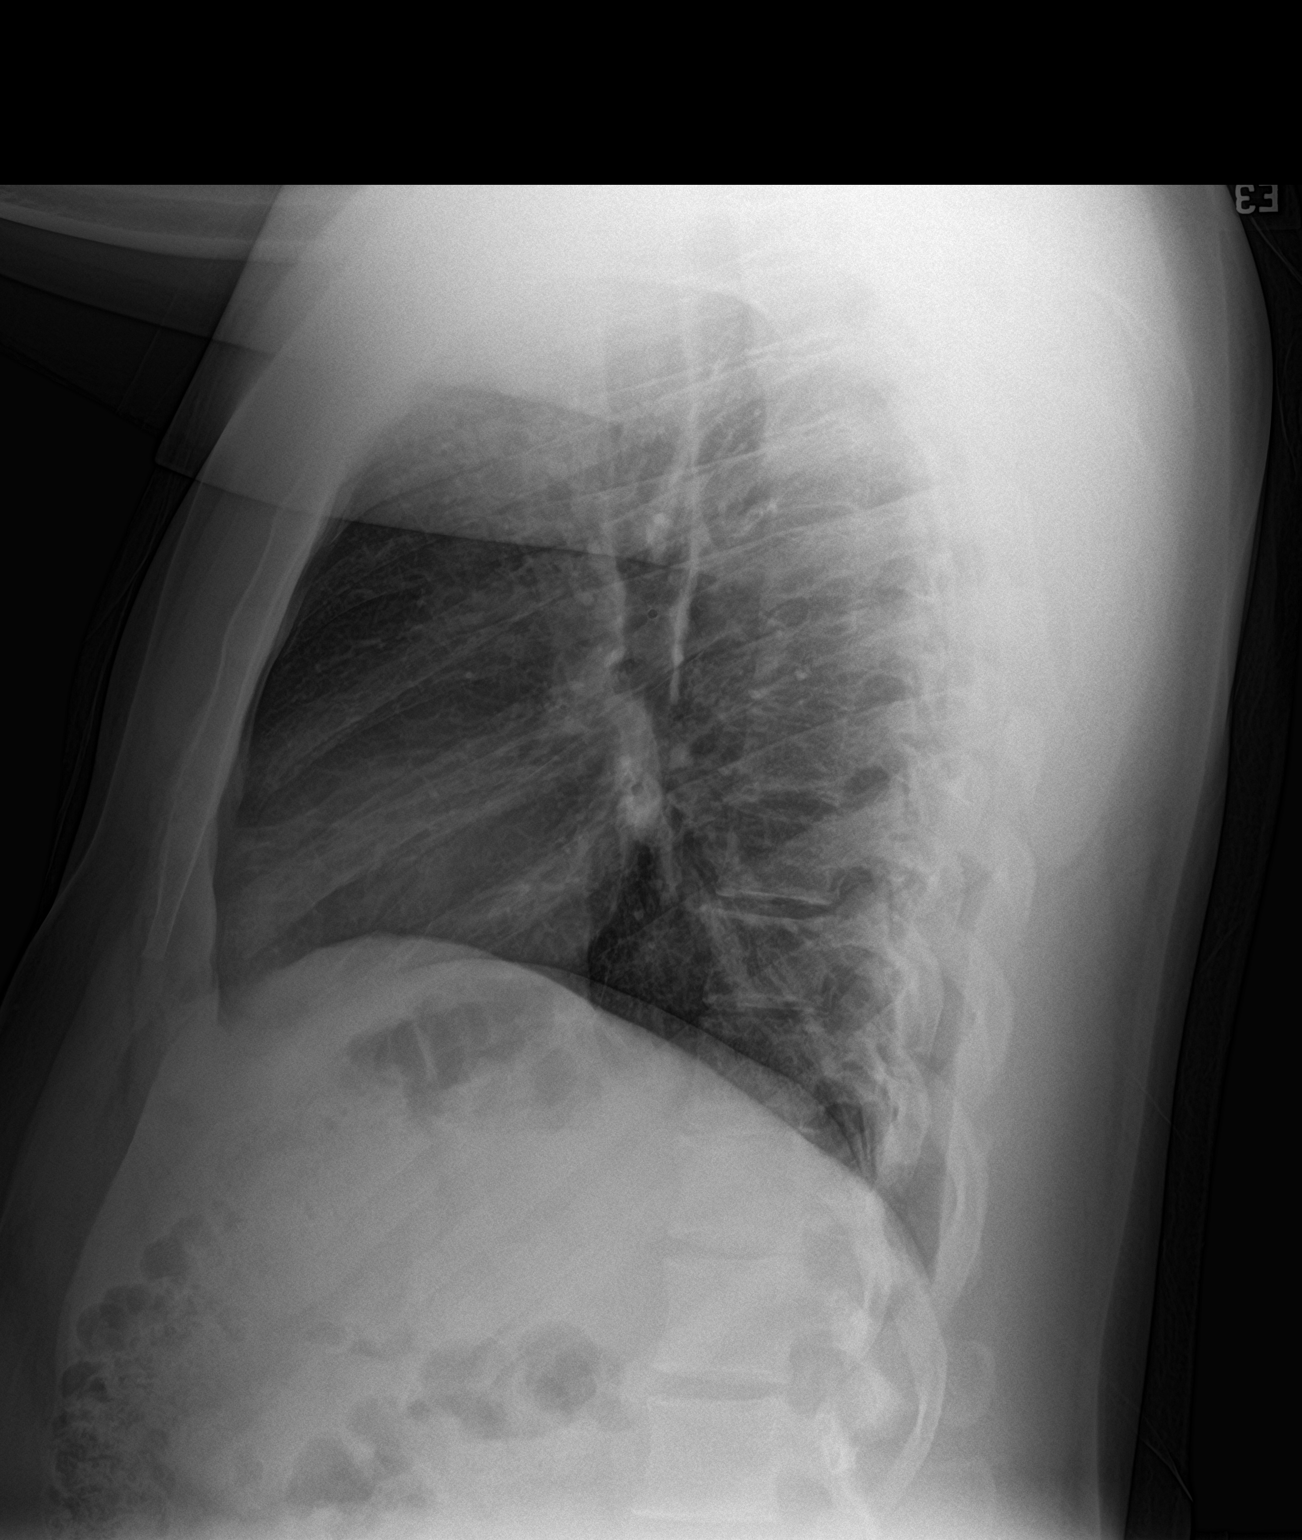

[2 of 2 positions shown; findings below may reference images not displayed]

FINDINGS: The heart size and mediastinal contours are within normal limits. No
focal airspace consolidation, pleural effusion, or pneumothorax.
Slight scoliotic thoracic curvature. The visualized skeletal
structures are otherwise unremarkable.
IMPRESSION: No active cardiopulmonary disease.

## 2024-05-09 ENCOUNTER — Telehealth: Payer: Self-pay | Admitting: Radiology

## 2024-05-09 ENCOUNTER — Ambulatory Visit (INDEPENDENT_AMBULATORY_CARE_PROVIDER_SITE_OTHER): Admitting: Orthopaedic Surgery

## 2024-05-09 ENCOUNTER — Encounter: Payer: Self-pay | Admitting: Orthopaedic Surgery

## 2024-05-09 ENCOUNTER — Encounter: Payer: Self-pay | Admitting: *Deleted

## 2024-05-09 VITALS — Ht 65.0 in | Wt 203.0 lb

## 2024-05-09 DIAGNOSIS — M79641 Pain in right hand: Secondary | ICD-10-CM

## 2024-05-09 DIAGNOSIS — S56911A Strain of unspecified muscles, fascia and tendons at forearm level, right arm, initial encounter: Secondary | ICD-10-CM | POA: Diagnosis not present

## 2024-05-09 MED ORDER — HYDROCODONE-ACETAMINOPHEN 5-325 MG PO TABS: 1.0000 | ORAL_TABLET | ORAL | 0 refills | Status: AC | PRN

## 2024-05-09 NOTE — Progress Notes (Addendum)
 Subjective:    Patient ID: Tony Green, male    DOB: Oct 12, 1995, 28 y.o.   MRN: [id number]  HPI He was lifting heavy rolls at work in [place] on 04-18-24.  He developed pain in the right hand and forearm.  His supervisor was present at the time of injury.  He was awake of it.  Patient went to Urgent Care a day or two later.  X-rays were done of the right hand which were negative.  He has pain in the right hand and inability to fully extend the middle, ring and little fingers with decreased sensation of the dorsum of these fingers.   He has continued to work but with pain.  He says it has not been reported to [employer].  He is concerned that his hand is not working well and not getting better.    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  He has no neck pain, no shoulder or elbow pain.  Review of Systems  Constitutional:  Positive for activity change.  Musculoskeletal:  Positive for myalgias.  All other systems reviewed and are negative. For Review of Systems, all other systems reviewed and are negative.  The following is a summary of the past history medically, past history surgically, known current medicines, social history and family history.  This information is gathered electronically by the computer from prior information and documentation.  I review this each visit and have found including this information at this point in the chart is beneficial and informative.   History reviewed. No pertinent past medical history.  History reviewed. No pertinent surgical history.  Current Outpatient Medications on File Prior to Visit  Medication Sig Dispense Refill   brompheniramine-pseudoephedrine-DM 30-2-10 MG/5ML syrup Take 5 mLs by mouth 4 (four) times daily as needed. 120 mL 0   No current facility-administered medications on file prior to visit.    Social History   Socioeconomic History   Marital status: Single    Spouse name: Not on  file   Number of children: Not on file   Years of education: Not on file   Highest education level: Not on file  Occupational History   Not on file  Tobacco Use   Smoking status: Some Days   Smokeless tobacco: Never  Substance and Sexual Activity   Alcohol use: Yes   Drug use: No   Sexual activity: Not on file  Other Topics Concern   Not on file  Social History Narrative   Not on file   Social Drivers of Health   Financial Resource Strain: Not on file  Food Insecurity: Not on file  Transportation Needs: Not on file  Physical Activity: Not on file  Stress: Not on file  Social Connections: Not on file  Intimate Partner Violence: Not on file    History reviewed. No pertinent family history.  Ht 5' 5 (1.651 m)   Wt 203 lb (92.1 kg)   BMI 33.78 kg/m   Body mass index is 33.78 kg/m.      Objective:   Physical Exam Vitals and nursing note reviewed. Exam conducted with a chaperone present.  Constitutional:      Appearance: He is well-developed.  HENT:     Head: Normocephalic and atraumatic.  Eyes:     Conjunctiva/sclera: Conjunctivae normal.     Pupils: Pupils are equal, round, and reactive to light.  Cardiovascular:     Rate and Rhythm: Normal rate  and regular rhythm.  Pulmonary:     Effort: Pulmonary effort is normal.  Abdominal:     Palpations: Abdomen is soft.  Musculoskeletal:       Arms:     Cervical back: Normal range of motion and neck supple.  Skin:    General: Skin is warm and dry.  Neurological:     Mental Status: He is alert and oriented to person, place, and time.     Cranial Nerves: No cranial nerve deficit.     Motor: No abnormal muscle tone.     Coordination: Coordination normal.     Deep Tendon Reflexes: Reflexes are normal and symmetric. Reflexes normal.  Psychiatric:        Behavior: Behavior normal.        Thought Content: Thought content normal.        Judgment: Judgment normal.     I have independently reviewed and interpreted  x-rays of this patient done at another site by another physician or qualified health professional.       Assessment & Plan:   Encounter Diagnoses  Name Primary?   Right hand pain Yes   Forearm strain, right, initial encounter    He does not have a true Benediction Sign as he has full motion of the thumb and flexion of the long finger.  Median nerve sensation appears intact.  ROM of wrist is good without drop but tender.  I am concerned about forearm muscle strain and partial tear plus nerve irration or compression.  I will have him see hand surgeon.  I am retiring in a few weeks.  I have asked that he report this as a Worker's Comp injury.  I have given note for limited use of the right arm and hand with no lifting greater than 20 pounds.  Call if any problem.  Precautions discussed.  Electronically Signed Lemond Stable, MD 8/28/202510:44 AM   He called later and asked for note to be out of work today.  Given.  I also called in pain medicine for him.  I have reviewed the [place]  Controlled Substance Reporting System web site prior to prescribing narcotic medicine for this patient.  Electronically Signed Lemond Stable, MD 8/28/20251:28 PM

## 2024-05-09 NOTE — Patient Instructions (Signed)
 No lifting with right arm, hand more than 20 pounds

## 2024-05-09 NOTE — Addendum Note (Signed)
 Addended by: BRENNA NORLEEN ORN on: 05/09/2024 01:27 PM   Modules accepted: Orders

## 2024-05-09 NOTE — Telephone Encounter (Signed)
 Patient was seen in [place] office this morning and is being referred to Dr. Arlinda.  Patient is requesting medication for pain. Please send to [facility] in [place].  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  He will turn in the note with restrictions when he returns to work next week.  Please email out of work note to [email or web address].  Please call patient at [phone] if/when pain medication is sent in.

## 2024-05-10 ENCOUNTER — Telehealth: Payer: Self-pay | Admitting: Orthopaedic Surgery

## 2024-05-10 NOTE — Telephone Encounter (Signed)
 Patient called. He would like to know if the forms he brought in yesterday, if they are ready to be faxed?

## 2024-05-16 ENCOUNTER — Telehealth: Payer: Self-pay | Admitting: Orthopaedic Surgery

## 2024-05-16 ENCOUNTER — Telehealth: Payer: Self-pay

## 2024-05-16 NOTE — Telephone Encounter (Signed)
 Work note at front desk for pick up notified in my chart

## 2024-05-16 NOTE — Telephone Encounter (Signed)
 Theres 2 notes from that day and another message about out of work What does the note need to say and Yes, but it will be after clinic, I can have it ready.

## 2024-05-16 NOTE — Telephone Encounter (Signed)
Duplicate message in chart.  

## 2024-05-16 NOTE — Telephone Encounter (Signed)
 Patient called. Asking about his work note. I told him that it's being handled right as we speak. He would like a call when it's ready.

## 2024-05-16 NOTE — Telephone Encounter (Signed)
 Patient called stating he is needing a note for work to continue to be out of work. Ok for this? If so, how long OOW. Please advise [phone]

## 2024-05-16 NOTE — Telephone Encounter (Signed)
 Patient called and needs a doctor note. He stated that he needs more time for work. His hand is still hurting badly. CB#[phone]

## 2024-05-17 NOTE — Telephone Encounter (Signed)
 [name]  reached out to patient through My Chart.

## 2024-05-22 ENCOUNTER — Telehealth: Payer: Self-pay | Admitting: Orthopaedic Surgery

## 2024-05-22 NOTE — Telephone Encounter (Signed)
 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 IC,lmvm for patient to [facility] to inform him of that we do not accept out of state WC. Spoke with Music therapist with [employer], advised her as well.  She will refer patient elsewhere. I am waiting to get verbal auth from patient to fax the 05/09/24 notes. Maggie ph [phone], fax [phone]  Deane, there is referral to Dr. Agarwala that needs to be canceled as we do not take out of state work comp. I don't have access to make changes on referrals.  Thank you for your help. Please let me know if I need to do anything further.

## 2024-05-23 NOTE — Telephone Encounter (Signed)
 IC patient again, na, lmvm advising that we do not take out of state workers comp as his is [place] . Advised his ov from 8/28 will be self pay or need to provide health insurance to file.

## 2024-06-05 ENCOUNTER — Encounter: Admitting: Orthopedic Surgery

## 2024-06-07 ENCOUNTER — Encounter (HOSPITAL_COMMUNITY): Payer: Self-pay

## 2024-06-07 ENCOUNTER — Emergency Department (HOSPITAL_COMMUNITY)
Admission: EM | Admit: 2024-06-07 | Discharge: 2024-06-07 | Disposition: A | Discharge status: Home / Self Care | Payer: Worker's Compensation | Attending: Emergency Medicine | Admitting: Emergency Medicine

## 2024-06-07 ENCOUNTER — Other Ambulatory Visit: Payer: Self-pay

## 2024-06-07 DIAGNOSIS — M25531 Pain in right wrist: Secondary | ICD-10-CM | POA: Insufficient documentation

## 2024-06-07 DIAGNOSIS — Y990 Civilian activity done for income or pay: Secondary | ICD-10-CM | POA: Insufficient documentation

## 2024-06-07 MED ORDER — HYDROCODONE-ACETAMINOPHEN 5-325 MG PO TABS: ORAL_TABLET | ORAL | 0 refills | Status: AC

## 2024-06-07 MED ORDER — IBUPROFEN 800 MG PO TABS: 800.0000 mg | ORAL_TABLET | Freq: Three times a day (TID) | ORAL | 0 refills | Status: AC

## 2024-06-07 NOTE — ED Triage Notes (Signed)
 Pt stated that he has been having right hand issues since 8/13, has seen 2 doctors for injury and is already dealing with WC for injury. Pt was recently taken off his pain management medication and stated that he is almost out of the medication. Pt here for pain management

## 2024-06-07 NOTE — Discharge Instructions (Signed)
 Continue to wear your brace as directed.  I recommend that you only take the hydrocodone  at night or while at home.  Do not operate machinery or drive while taking the medication.  You may take the ibuprofen  as directed with food during the day.  Please keep your upcoming appointment with your orthopedic provider and your physical therapy appointment.

## 2024-06-07 NOTE — ED Provider Notes (Signed)
 [place] EMERGENCY DEPARTMENT AT [facility] Provider Note   CSN: [id number] Arrival date & time: 06/07/24  1400     Patient presents with: Hand Injury   Tony Green is a 28 y.o. male.    Hand Injury Associated symptoms: no fever and no neck pain        Tony Green is a 28 y.o. male who presents to the Emergency Department complaining of persistent pain of his right wrist.  States he suffered a work-related injury in August.  He has been seen by orthopedics for this and states that imaging of the wrist has been done. Initially, he was on prednisone and hydrocodone  which he states were helping his symptoms.  He was referred to another orthopedist and prescribed gabapentin and instructed to return back to work with light duty.  Since that time, he states his pain has worsened and he is having difficulty with rotating and flexing of his right wrist.  He denies any numbness or tingling of his hand or fingers.  No shoulder or elbow pain also denies any redness or swelling.  He is scheduled to start physical therapy on Monday.  Here today requesting pain control.  He is right-hand dominant   Prior to Admission medications   Medication Sig Start Date End Date Taking? Authorizing Provider  brompheniramine-pseudoephedrine-DM 30-2-10 MG/5ML syrup Take 5 mLs by mouth 4 (four) times daily as needed. 06/04/21   Claudene Tanda POUR, PA-C    Allergies: Patient has no known allergies.    Review of Systems  Constitutional:  Negative for chills and fever.  Respiratory:  Negative for shortness of breath.   Cardiovascular:  Negative for chest pain.  Gastrointestinal:  Negative for abdominal pain, nausea and vomiting.  Musculoskeletal:  Positive for arthralgias (right wrist pain). Negative for joint swelling and neck pain.  Skin:  Negative for color change.  Neurological:  Negative for dizziness, weakness, numbness and headaches.    Updated Vital Signs BP (!) 144/80   Pulse 62    Temp 98.4 F (36.9 C)   Resp 18   Ht 5' 5 (1.651 m)   Wt 90.7 kg   SpO2 98%   BMI 33.28 kg/m   Physical Exam Vitals and nursing note reviewed.  Constitutional:      General: He is not in acute distress.    Appearance: Normal appearance. He is not toxic-appearing.  Cardiovascular:     Rate and Rhythm: Normal rate and regular rhythm.     Pulses: Normal pulses.  Pulmonary:     Effort: Pulmonary effort is normal.     Breath sounds: Normal breath sounds.  Musculoskeletal:        General: Tenderness present. No swelling or deformity.     Right wrist: Tenderness and snuff box tenderness present. No swelling or deformity. Decreased range of motion. Normal pulse.     Comments: Tenderness to the radial aspect of the right wrist.  Do not appreciate any erythema, edema or excessive warmth.  He is able to flex and extend his fingers but has pain with finger thumb opposition.  Skin:    General: Skin is warm.     Capillary Refill: Capillary refill takes less than 2 seconds.     Findings: No bruising, erythema or rash.  Neurological:     General: No focal deficit present.     Mental Status: He is alert.     Sensory: No sensory deficit.     Motor: No weakness.     (  all labs ordered are listed, but only abnormal results are displayed) Labs Reviewed - No data to display  EKG: None  Radiology: No results found.   Procedures   Medications Ordered in the ED - No data to display                                  Medical Decision Making Patient here with Worker's Comp. injury that occurred earlier in the month.  He has been followed by orthopedics for his injury, here today requesting pain control.  States that he saw a new orthopedic provider recently and was returned back to work with light duty now having worsening pain of his wrist.  Scheduled to start physical therapy on Monday.  Denies any swelling numbness or weakness of his hand or fingers.  He is wearing a Velcro wrist  splint.  Extremity neurovascularly intact on exam.  No clinical findings suggestive of infectious process.  Suspect ligamentous injury.  No recent imaging in the system for review and no reported new injury to indicate need for additional imaging today  Amount and/or Complexity of Data Reviewed Discussion of management or test interpretation with external provider(s): Patient will continue to wear his splint keep upcoming appointment with his orthopedic provider.  Short course of pain medication provided for nighttime use only, recommended 800 mg ibuprofen  to take during the day along with food.  Appears appropriate for discharge home and have recommended close outpatient follow-up with his orthopedic provider  Risk Prescription drug management.        Final diagnoses:  Right wrist pain    ED Discharge Orders     None          Herlinda Milling, PA-C 06/07/24 1501    Francesca Elsie CROME, MD 06/07/24 1523

## 2024-07-15 ENCOUNTER — Encounter: Payer: Self-pay | Admitting: Radiology
